# Patient Record
Sex: Male | Born: 2000 | Race: White | Hispanic: Yes | Marital: Single | Smoking: Never smoker
Health system: Southern US, Community
[De-identification: ages and names within clinical notes are randomized; demographics above are authoritative.]

## PROBLEM LIST (undated history)

## (undated) ENCOUNTER — Emergency Department (HOSPITAL_COMMUNITY): Admission: EM | Payer: Medicaid Other | Source: Home / Self Care

## (undated) DIAGNOSIS — E031 Congenital hypothyroidism without goiter: Secondary | ICD-10-CM

## (undated) DIAGNOSIS — S42002A Fracture of unspecified part of left clavicle, initial encounter for closed fracture: Secondary | ICD-10-CM

## (undated) HISTORY — DX: Fracture of unspecified part of left clavicle, initial encounter for closed fracture: S42.002A

## (undated) HISTORY — DX: Congenital hypothyroidism without goiter: E03.1

---
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
## 2021-03-05 DIAGNOSIS — E031 Congenital hypothyroidism without goiter: Secondary | ICD-10-CM | POA: Diagnosis not present

## 2021-03-05 LAB — T4, FREE: Free T4: 1.1 ng/dL (ref 0.8–1.4)

## 2021-03-05 LAB — T4: T4, Total: 7.1 ug/dL (ref 5.1–10.3)

## 2021-03-05 LAB — TSH: TSH: >150 mIU/L — ABNORMAL HIGH (ref 0.50–4.30)

## 2021-03-08 ENCOUNTER — Ambulatory Visit (INDEPENDENT_AMBULATORY_CARE_PROVIDER_SITE_OTHER): Payer: Medicaid Other | Admitting: Family

## 2021-03-08 NOTE — Progress Notes (Deleted)
Subjective:  Subjective  Patient Name: Nicolas Marshall Date of Birth: 2001-07-28  MRN: [id number]  Nicolas Marshall  presents to the office today for follow up evaluation and management of his congenital hypothyroidism  HISTORY OF PRESENT ILLNESS:   Nicolas Marshall is a 20 y.o. Hispanic male   Nicolas Marshall was accompanied by his Grandmother.   1. Nicolas Marshall was diagnosed with congenital hypothyroidism on his NBS at 1 week of life.  He was followed in [place] until age 42 years. At that time family relocated to [place] and he was managed by his PCP there. When they relocated back to [place] his PCP was following [facility]). When mom transitioned his care to [facility] in February 2015, his TSH was elevated at 61. He was supposed to be taking 100 mcg of Synthroid daily. He was referred to endocrinology for further evaluation and management of his thyroid function.   2. Nicolas Marshall's last clinic visit was on 08/2020 In the interim he has been generally healthy.   He will graduate high school in June, he plans to take some time off and work. He is currently working at [employer].   He was increased to 200 mcg of levothyroxine after his last visit. However, he took "10 pills" then did not like the way he felt. He stopped taking it completely for a month or so until his PCP refilled 175 mcg.   He reports he is taking 175 mcg daily, denies missed doses. He denies fatigue and cold intolerance.   3. Pertinent Review of Systems:  Constitutional: good energy. Sleeping.  Eyes: No vision changes. No blurry vision.  Neck: no neck pain. No trouble swallowing  Heart: no palpitation. No chest pain  Lung: No SOB. No trouble breathing  GI: + intermittent constipation. No abdominal pain  Endocrine: See HPI  Neuro: No tremor. No headache.  Psych: normal affect. No depression or anxiety.    PAST MEDICAL, FAMILY, AND SOCIAL HISTORY  Past Medical History:  Diagnosis Date   Closed left clavicular  fracture 04/21/2016   Congenital hypothyroidism     Family History  Problem Relation Age of Onset   Diabetes Mother    Diabetes Maternal Grandmother    Diabetes Paternal Grandmother    Thyroid disease Paternal Grandfather    Asthma Sister      Current Outpatient Medications:    cetirizine (ZYRTEC ALLERGY) 10 MG tablet, Take 1 tablet (10 mg total) by mouth daily for 5 days., Disp: 5 tablet, Rfl: 0   levothyroxine (SYNTHROID) 175 MCG tablet, TAKE 1 TABLET(175 MCG) BY MOUTH DAILY BEFORE BREAKFAST, Disp: 90 tablet, Rfl: 1   ondansetron (ZOFRAN) 8 MG tablet, Take 1 tablet (8 mg total) by mouth every 8 (eight) hours as needed for nausea or vomiting. (Patient not taking: Reported on 09/07/2020), Disp: 10 tablet, Rfl: 0   polyethylene glycol powder (GLYCOLAX/MIRALAX) 17 GM/SCOOP powder, MIX 1 CAPFUL IN 8 OUNCES OF LIQUID AND DRINK DAILY (Patient not taking: Reported on 09/07/2020), Disp: 510 g, Rfl: 0  Allergies as of 03/08/2021   (No Known Allergies)     reports that he has never smoked. He has never used smokeless tobacco. He reports that he does not drink alcohol and does not use drugs. Pediatric History  Patient Parents/Guardians   Nicolas Marshall,Nicolas Marshall (Mother/Guardian)   Other Topics Concern   Not on file  Social History Narrative   Lives at home with mom and three siblings 10 th grade at [employer]    1. School and Family: [employer] 12th grade     2. Activities: soccer for fun  3. Primary Care Provider: Theadore Nan, MD  ROS: There are no other significant problems involving Nicolas Marshall's other body systems.    Objective:  Objective  Vital Signs:  There were no vitals taken for this visit.  Blood pressure percentiles are not available for patients who are 18 years or older.  Ht Readings from Last 3 Encounters:  08/13/20 5' 8.11" (1.73 m) (33 %, Z= -0.45)*  03/27/20 5' 8.35" (1.736 m) (37 %, Z= -0.33)*  04/08/19 5' 7.91" (1.725 m) (37 %, Z= -0.34)*    * Growth percentiles are based on CDC (Boys, 2-20 Years) data.   Wt Readings from Last 3 Encounters:  09/07/20 214 lb 9.6 oz (97.3 kg) (97 %, Z= 1.87)*  08/13/20 211 lb 6.4 oz (95.9 kg) (96 %, Z= 1.81)*  05/19/20 198 lb 9.6 oz (90.1 kg) (94 %, Z= 1.57)*   * Growth percentiles are based on CDC (Boys, 2-20 Years) data.   HC Readings from Last 3 Encounters:  No data found for [facility]   There is no height or weight on file to calculate BSA. No height on file for this encounter. No weight on file for this encounter.    PHYSICAL EXAM: General: Well developed, well nourished male in no acute distress.   Head: Normocephalic, atraumatic.   Eyes:  Pupils equal and round. EOMI.  Sclera white.  No eye drainage.   Ears/Nose/Mouth/Throat: Nares patent, no nasal drainage.  Normal dentition, mucous membranes moist.  Neck: supple, no cervical lymphadenopathy, no thyromegaly Cardiovascular: regular rate, normal S1/S2, no murmurs Respiratory: No increased work of breathing.  Lungs clear to auscultation bilaterally.  No wheezes. Abdomen: soft, nontender, nondistended. Normal bowel sounds.  No appreciable masses  Extremities: warm, well perfused, cap refill < 2 sec.   Musculoskeletal: Normal muscle mass.  Normal strength Skin: warm, dry.  No rash or lesions. Neurologic: alert and oriented, normal speech, no tremor    LAB DATA:      Assessment and Plan:  Assessment  ASSESSMENT: Nicolas Marshall is an 20 year old male with congenital hypothyroidism on levotyroxine therapy. He continues to be noncompliant with medication which has resulted in severely elevated TSH.   PLAN:  1. Diagnostic: Reviewed labs. Repeat TSH, FT4 and T4 in 3 months.  2. Therapeutic: 175 mcg of levothyroxine per day  3. Patient education: Discussed importance of compliance with thyroid medication. Reviewed s/s of hypothyroidism. Answered questions.     LOS: >30  spent today reviewing the medical chart, counseling the  patient/family, and documenting today's visit.     Gretchen Short,  FNP-C  Pediatric Specialist  [address]  [place], [address]  Tele: [phone]

## 2021-04-08 ENCOUNTER — Telehealth (INDEPENDENT_AMBULATORY_CARE_PROVIDER_SITE_OTHER): Payer: Self-pay | Admitting: Family

## 2021-04-08 NOTE — Telephone Encounter (Signed)
Spoke with mom le her know that patient doesn't need lab work per [employer].

## 2021-04-08 NOTE — Telephone Encounter (Signed)
  Who's calling (name and relationship to patient) :mother   Nicolas Marshall (Mother)   Best contact number:  [phone] (Home   Provider they see: Ovidio Kin  Reason for call: Mom want to know if lab work will need to be done before seeing [name]     PRESCRIPTION REFILL ONLY  Name of prescription:  Pharmacy:

## 2021-04-09 ENCOUNTER — Ambulatory Visit (INDEPENDENT_AMBULATORY_CARE_PROVIDER_SITE_OTHER): Payer: Medicaid Other | Admitting: Family

## 2021-04-09 NOTE — Progress Notes (Deleted)
Subjective:  Subjective  Patient Name: Nicolas Marshall Date of Birth: 2001/06/22  MRN: [id number]  Nicolas Marshall  presents to the office today for follow up evaluation and management of his congenital hypothyroidism  HISTORY OF PRESENT ILLNESS:   Nicolas Marshall is a 20 y.o. Hispanic male   Nicolas Marshall was accompanied by his Grandmother.   1. Nicolas Marshall was diagnosed with congenital hypothyroidism on his NBS at 1 week of life.  He was followed in [place] until age 42 years. At that time family relocated to [place] and he was managed by his PCP there. When they relocated back to [place] his PCP was following [facility]). When mom transitioned his care to [facility] in February 2015, his TSH was elevated at 61. He was supposed to be taking 100 mcg of Synthroid daily. He was referred to endocrinology for further evaluation and management of his thyroid function.   2. Nicolas Marshall's last clinic visit was on 03/2019. In the interim he has been generally healthy.   He will graduate high school in June, he plans to take some time off and work. He is currently working at [employer].   He was increased to 200 mcg of levothyroxine after his last visit. However, he took "10 pills" then did not like the way he felt. He stopped taking it completely for a month or so until his PCP refilled 175 mcg.   He reports he is taking 175 mcg daily, denies missed doses. He denies fatigue and cold intolerance.   3. Pertinent Review of Systems:  Constitutional: good energy. Sleeping.  Eyes: No vision changes. No blurry vision.  Neck: no neck pain. No trouble swallowing  Heart: no palpitation. No chest pain  Lung: No SOB. No trouble breathing  GI: + intermittent constipation. No abdominal pain  Endocrine: See HPI  Neuro: No tremor. No headache.  Psych: normal affect. No depression or anxiety.    PAST MEDICAL, FAMILY, AND SOCIAL HISTORY  Past Medical History:  Diagnosis Date   Closed left clavicular  fracture 04/21/2016   Congenital hypothyroidism     Family History  Problem Relation Age of Onset   Diabetes Mother    Diabetes Maternal Grandmother    Diabetes Paternal Grandmother    Thyroid disease Paternal Grandfather    Asthma Sister      Current Outpatient Medications:    cetirizine (ZYRTEC ALLERGY) 10 MG tablet, Take 1 tablet (10 mg total) by mouth daily for 5 days., Disp: 5 tablet, Rfl: 0   levothyroxine (SYNTHROID) 175 MCG tablet, TAKE 1 TABLET(175 MCG) BY MOUTH DAILY BEFORE BREAKFAST, Disp: 90 tablet, Rfl: 1   ondansetron (ZOFRAN) 8 MG tablet, Take 1 tablet (8 mg total) by mouth every 8 (eight) hours as needed for nausea or vomiting. (Patient not taking: Reported on 09/07/2020), Disp: 10 tablet, Rfl: 0   polyethylene glycol powder (GLYCOLAX/MIRALAX) 17 GM/SCOOP powder, MIX 1 CAPFUL IN 8 OUNCES OF LIQUID AND DRINK DAILY (Patient not taking: Reported on 09/07/2020), Disp: 510 g, Rfl: 0  Allergies as of 04/09/2021   (No Known Allergies)     reports that he has never smoked. He has never used smokeless tobacco. He reports that he does not drink alcohol and does not use drugs. Pediatric History  Patient Parents/Guardians   Chavez,Sara (Mother/Guardian)   Other Topics Concern   Not on file  Social History Narrative   Lives at home with mom and three siblings 10 th grade at [employer]    1. School and Family: [employer] 12th grade     2. Activities: soccer for fun  3. Primary Care Provider: Theadore Nan, MD  ROS: There are no other significant problems involving T[place]ond's other body systems.    Objective:  Objective  Vital Signs:  There were no vitals taken for this visit.  Blood pressure percentiles are not available for patients who are 18 years or older.  Ht Readings from Last 3 Encounters:  08/13/20 5' 8.11" (1.73 m) (33 %, Z= -0.45)*  03/27/20 5' 8.35" (1.736 m) (37 %, Z= -0.33)*  04/08/19 5' 7.91" (1.725 m) (37 %, Z= -0.34)*    * Growth percentiles are based on CDC (Boys, 2-20 Years) data.   Wt Readings from Last 3 Encounters:  09/07/20 214 lb 9.6 oz (97.3 kg) (97 %, Z= 1.87)*  08/13/20 211 lb 6.4 oz (95.9 kg) (96 %, Z= 1.81)*  05/19/20 198 lb 9.6 oz (90.1 kg) (94 %, Z= 1.57)*   * Growth percentiles are based on CDC (Boys, 2-20 Years) data.   HC Readings from Last 3 Encounters:  No data found for [facility]   There is no height or weight on file to calculate BSA. No height on file for this encounter. No weight on file for this encounter.    PHYSICAL EXAM: General: Well developed, well nourished male in no acute distress.   Head: Normocephalic, atraumatic.   Eyes:  Pupils equal and round. EOMI.  Sclera white.  No eye drainage.   Ears/Nose/Mouth/Throat: Nares patent, no nasal drainage.  Normal dentition, mucous membranes moist.  Neck: supple, no cervical lymphadenopathy, no thyromegaly Cardiovascular: regular rate, normal S1/S2, no murmurs Respiratory: No increased work of breathing.  Lungs clear to auscultation bilaterally.  No wheezes. Abdomen: soft, nontender, nondistended. Normal bowel sounds.  No appreciable masses  Extremities: warm, well perfused, cap refill < 2 sec.   Musculoskeletal: Normal muscle mass.  Normal strength Skin: warm, dry.  No rash or lesions. Neurologic: alert and oriented, normal speech, no tremor   LAB DATA:      Assessment and Plan:  Assessment  ASSESSMENT:  1. Congenital hypothyroidism: He is clinically euthyroid on 175 mcg of levothyroxine.  2. Constipation: stable on daily fiber supplement.   PLAN:  1. Diagnostic: TSH, FT4 and T4 ordered  2. Therapeutic: 175 mcg of levothyroxine per day  3. Patient education: Reviewed growth chart. Discussed side effects of medication and S/s of hypothyroidism. Answered questions.  6 months follow up     LOS: >30  spent today reviewing the medical chart, counseling the patient/family, and documenting today's visit.     Gretchen Short,  FNP-C  Pediatric Specialist  [address]  [place], [address]  Tele: [phone]

## 2021-04-13 ENCOUNTER — Other Ambulatory Visit: Payer: Self-pay

## 2021-04-13 ENCOUNTER — Ambulatory Visit (INDEPENDENT_AMBULATORY_CARE_PROVIDER_SITE_OTHER): Payer: Medicaid Other | Admitting: Family

## 2021-04-13 ENCOUNTER — Encounter (INDEPENDENT_AMBULATORY_CARE_PROVIDER_SITE_OTHER): Payer: Self-pay | Admitting: Family

## 2021-04-13 VITALS — BP 118/74 | HR 64 | Ht 68.39 in | Wt 225.2 lb

## 2021-04-13 DIAGNOSIS — E031 Congenital hypothyroidism without goiter: Secondary | ICD-10-CM

## 2021-04-13 DIAGNOSIS — K5900 Constipation, unspecified: Secondary | ICD-10-CM

## 2021-04-13 NOTE — Patient Instructions (Addendum)
You need to take your LEVOTHYROXINE everyday  - 175 mcg of levothyroxine per day  - Get labs drawn in 1 month  - follow up in 3 months.   Please sign up for MyChart. This is a communication tool that allows you to send an email directly to me. This can be used for questions, prescriptions and blood sugar reports. We will also release labs to you with instructions on MyChart. Please do not use MyChart if you need immediate or emergency assistance. Ask our wonderful front office staff if you need assistance.   It was a pleasure seeing you in clinic today. Please do not hesitate to contact me if you have questions or concerns.   At [facility], we are committed to providing exceptional care. You will receive a patient satisfaction survey through text or email regarding your visit today. Your opinion is important to me. Comments are appreciated.

## 2021-04-13 NOTE — Progress Notes (Signed)
Subjective:  Subjective  Patient Name: Nicolas Marshall Date of Birth: [birth year]  MRN: [id number]  Nicolas Marshall  presents to the office today for follow up evaluation and management of his congenital hypothyroidism  HISTORY OF PRESENT ILLNESS:   Nicolas Marshall is a 20 y.o. Hispanic male   Nicolas Marshall was accompanied by his Grandmother.   1. Nicolas Marshall was diagnosed with congenital hypothyroidism on his NBS at 1 week of life.  He was followed in [place] until age 60 years. At that time family relocated to [place] and he was managed by his PCP there. When they relocated back to [place] his PCP was following [facility]). When mom transitioned his care to [facility] in February 2015, his TSH was elevated at 61. He was supposed to be taking 100 mcg of Synthroid daily. He was referred to endocrinology for further evaluation and management of his thyroid function.   2. Nicolas Marshall's last clinic visit was on 03/2019. In the interim he has been generally healthy.   He graduated from high school and working at [employer]. He is considering going back to school.  Prescribed to 175 mcg of levothyroxine per day. He reports he stopped taking levothyroxine completely from feb until about a few weeks ago (shortly after he got his labs drawn).  After he got his blood work done he started taking one tablet every morning.   He reports that his energy has gotten much better over the past few weeks. He was struggling with fatigue, weigh gain and cold cold intolerance. He has intermittent constipation   3. Pertinent Review of Systems:  Constitutional: 11 lbs weight gain. Sleeping.  Eyes: No vision changes. No blurry vision.  Neck: no neck pain. No trouble swallowing  Heart: no palpitation. No chest pain  Lung: No SOB. No trouble breathing  GI: + intermittent constipation. No abdominal pain  Endocrine: See HPI  Neuro: No tremor. No headache.  Psych: normal affect. No depression or  anxiety.    PAST MEDICAL, FAMILY, AND SOCIAL HISTORY  Past Medical History:  Diagnosis Date   Closed left clavicular fracture 04/21/2016   Congenital hypothyroidism     Family History  Problem Relation Age of Onset   Diabetes Mother    Diabetes Maternal Grandmother    Diabetes Paternal Grandmother    Thyroid disease Paternal Grandfather    Asthma Sister      Current Outpatient Medications:    levothyroxine (SYNTHROID) 175 MCG tablet, TAKE 1 TABLET(175 MCG) BY MOUTH DAILY BEFORE BREAKFAST, Disp: 90 tablet, Rfl: 1   cetirizine (ZYRTEC ALLERGY) 10 MG tablet, Take 1 tablet (10 mg total) by mouth daily for 5 days., Disp: 5 tablet, Rfl: 0   ondansetron (ZOFRAN) 8 MG tablet, Take 1 tablet (8 mg total) by mouth every 8 (eight) hours as needed for nausea or vomiting. (Patient not taking: No sig reported), Disp: 10 tablet, Rfl: 0   polyethylene glycol powder (GLYCOLAX/MIRALAX) 17 GM/SCOOP powder, MIX 1 CAPFUL IN 8 OUNCES OF LIQUID AND DRINK DAILY (Patient not taking: No sig reported), Disp: 510 g, Rfl: 0  Allergies as of 04/13/2021   (No Known Allergies)     reports that he has never smoked. He has never used smokeless tobacco. He reports that he does not drink alcohol and does not use drugs. Pediatric History  Patient Parents/Guardians   Chavez,Sara (Mother/Guardian)   Other Topics Concern   Not on file  Social History Narrative   Lives at  home with mom and three siblings 10 th grade at [employer]    2. Activities: soccer for fun  3. Primary Care Provider: Theadore Nan, MD  ROS: There are no other significant problems involving [place]'s other body systems.    Objective:  Objective  Vital Signs:  BP 118/74 (BP Location: Right Arm, Patient Position: Sitting, Cuff Size: Normal)   Pulse 64   Ht 5' 8.39" (1.737 m)   Wt 225 lb 3.2 oz (102.2 kg)   BMI 33.86 kg/m   Blood pressure percentiles are not available for patients who are 18 years or older.  Ht  Readings from Last 3 Encounters:  04/13/21 5' 8.39" (1.737 m) (35 %, Z= -0.40)*  08/13/20 5' 8.11" (1.73 m) (33 %, Z= -0.45)*  03/27/20 5' 8.35" (1.736 m) (37 %, Z= -0.33)*   * Growth percentiles are based on CDC (Boys, 2-20 Years) data.   Wt Readings from Last 3 Encounters:  04/13/21 225 lb 3.2 oz (102.2 kg) (98 %, Z= 2.02)*  09/07/20 214 lb 9.6 oz (97.3 kg) (97 %, Z= 1.87)*  08/13/20 211 lb 6.4 oz (95.9 kg) (96 %, Z= 1.81)*   * Growth percentiles are based on CDC (Boys, 2-20 Years) data.   HC Readings from Last 3 Encounters:  No data found for [facility]   Body surface area is 2.22 meters squared. 35 %ile (Z= -0.40) based on CDC (Boys, 2-20 Years) Stature-for-age data based on Stature recorded on 04/13/2021. 98 %ile (Z= 2.02) based on CDC (Boys, 2-20 Years) weight-for-age data using vitals from 04/13/2021.    PHYSICAL EXAM: General: Well developed, well nourished male in no acute distress.   Head: Normocephalic, atraumatic.   Eyes:  Pupils equal and round. EOMI.  Sclera white.  No eye drainage.   Ears/Nose/Mouth/Throat: Nares patent, no nasal drainage.  Normal dentition, mucous membranes moist.  Neck: supple, no cervical lymphadenopathy, no thyromegaly Cardiovascular: regular rate, normal S1/S2, no murmurs Respiratory: No increased work of breathing.  Lungs clear to auscultation bilaterally.  No wheezes. Abdomen: soft, nontender, nondistended. Normal bowel sounds.  No appreciable masses  Extremities: warm, well perfused, cap refill < 2 sec.   Musculoskeletal: Normal muscle mass.  Normal strength Skin: warm, dry.  No rash or lesions. Neurologic: alert and oriented, normal speech, no tremor   LAB DATA:  Results for orders placed or performed in visit on 09/07/20  TSH  Result Value Ref Range   TSH >150.00 (H) 0.50 - 4.30 mIU/L  T4, free  Result Value Ref Range   Free T4 1.0 0.8 - 1.4 ng/dL  T4  Result Value Ref Range   T4, Total 6.3 5.1 - 10.3 mcg/dL       Assessment and Plan:   Assessment  ASSESSMENT:  1. Congenital hypothyroidism: He is clinically and biochemically hypothyroid due to noncompliance with levothyroxine.  2. Constipation: intermittent   PLAN:  1. Diagnostic: Repeat TSH, FT4 and T4 in one month after consistently taking levothyroxine 175 mcg  2. Therapeutic: 175 mcg of levothyroxine per day  3. Patient education: Reviewed growth chart. Stressed importance of compliance with taking levothyroxine daily. Discussed plan for repeat labs and follow up. Answered questions.  3 months. Labs in 1 month.     LOS: >30  spent today reviewing the medical chart, counseling the patient/family, and documenting today's visit.     Gretchen Short,  FNP-C  Pediatric Specialist  [address]  [place], [address]  Tele:  [phone]   

## 2021-07-13 ENCOUNTER — Ambulatory Visit (INDEPENDENT_AMBULATORY_CARE_PROVIDER_SITE_OTHER): Payer: Medicaid Other | Admitting: Family

## 2021-09-18 ENCOUNTER — Other Ambulatory Visit (INDEPENDENT_AMBULATORY_CARE_PROVIDER_SITE_OTHER): Payer: Self-pay | Admitting: Family

## 2021-09-18 DIAGNOSIS — K529 Noninfective gastroenteritis and colitis, unspecified: Secondary | ICD-10-CM

## 2021-12-31 ENCOUNTER — Telehealth (INDEPENDENT_AMBULATORY_CARE_PROVIDER_SITE_OTHER): Payer: Self-pay | Admitting: Family

## 2021-12-31 ENCOUNTER — Other Ambulatory Visit (INDEPENDENT_AMBULATORY_CARE_PROVIDER_SITE_OTHER): Payer: Self-pay | Admitting: Family

## 2021-12-31 DIAGNOSIS — K529 Noninfective gastroenteritis and colitis, unspecified: Secondary | ICD-10-CM

## 2021-12-31 MED ORDER — LEVOTHYROXINE SODIUM 175 MCG PO TABS: ORAL_TABLET | ORAL | 0 refills | Status: DC

## 2021-12-31 NOTE — Telephone Encounter (Signed)
  Name of who is calling: Maralyn Sago (DPR on file giving permission to speak with her).  Caller's Relationship to Patient: Mother  Best contact number: [phone]  Provider they see: Gretchen Short, NP  Reason for call: I advised mother that patient is due for a follow up appointment. She stated patient is working in [place] and goes back and forth between here and there. She will have him call to schedule once he knows his work schedule.      PRESCRIPTION REFILL ONLY  Name of prescription: Levothyroxine   Pharmacy: [facility] on [facility] [address] [facility] [address] [facility] [address] [facility]

## 2022-03-09 ENCOUNTER — Ambulatory Visit (INDEPENDENT_AMBULATORY_CARE_PROVIDER_SITE_OTHER): Payer: Medicaid Other | Admitting: Family

## 2022-04-14 ENCOUNTER — Ambulatory Visit (INDEPENDENT_AMBULATORY_CARE_PROVIDER_SITE_OTHER): Payer: Medicaid Other | Admitting: Family

## 2022-05-02 ENCOUNTER — Ambulatory Visit (INDEPENDENT_AMBULATORY_CARE_PROVIDER_SITE_OTHER): Payer: Medicaid Other | Admitting: Family

## 2022-05-02 ENCOUNTER — Encounter (INDEPENDENT_AMBULATORY_CARE_PROVIDER_SITE_OTHER): Payer: Self-pay | Admitting: Family

## 2022-05-02 ENCOUNTER — Other Ambulatory Visit (INDEPENDENT_AMBULATORY_CARE_PROVIDER_SITE_OTHER): Payer: Self-pay | Admitting: Family

## 2022-05-02 VITALS — BP 118/76 | HR 71 | Wt 226.0 lb

## 2022-05-02 DIAGNOSIS — E031 Congenital hypothyroidism without goiter: Secondary | ICD-10-CM | POA: Diagnosis not present

## 2022-05-02 DIAGNOSIS — R197 Diarrhea, unspecified: Secondary | ICD-10-CM

## 2022-05-02 NOTE — Progress Notes (Signed)
Subjective:  Subjective  Patient Name: Nicolas Marshall Date of Birth: 12/26/2000  MRN: [id number]  Nicolas Marshall  presents to the office today for follow up evaluation and management of his congenital hypothyroidism  HISTORY OF PRESENT ILLNESS:   Nicolas Marshall is a 21 y.o. Hispanic male   Nicolas Marshall was accompanied by his Grandmother.   1. Nicolas Marshall was diagnosed with congenital hypothyroidism on his NBS at 1 week of life.  He was followed in [place] until age 21 years. At that time family relocated to [place] and he was managed by his PCP there. When they relocated back to [place] his PCP was following ([facility]). When mom transitioned his care to [facility] for [place] in February 2015, his TSH was elevated at 61. He was supposed to be taking 100 mcg of Synthroid daily. He was referred to endocrinology for further evaluation and management of his thyroid function.   2. Nicolas Marshall's last clinic visit was on 04/2021 he has been well. He was instructed to follow up one month after restarting levothyroxine therapy at his last visit but cancelled or no show visits on 07/13/2021 and 03/2022.    He is working full time, spends about 2 weeks per month working in [place]. He ran out of levothyroxine about 3 weeks ago but reports taking medication consistently since then. He states that he had a "200 mcg" dose that was left over and he has been cutting that in half. Reports mild fatigue and constipation. Denies constipation.   He went to [place] for vacation about 2 weeks ago, while there he started having diarrhea. He reports he is feeling well but still has occasional diarrhea. No fever, abdominal pain, nausea, or vomiting.    3. Pertinent Review of Systems:  Constitutional: Weight is stable. Sleeping.  Eyes: No vision changes. No blurry vision.  Neck: no neck pain. No trouble swallowing  Heart: no palpitation. No chest pain  Lung: No SOB. No trouble breathing  GI: +diarrhea since returning  form [place] No abdominal pain  Endocrine: See HPI  Neuro: No tremor. No headache.  Psych: normal affect. No depression or anxiety.    PAST MEDICAL, FAMILY, AND SOCIAL HISTORY  Past Medical History:  Diagnosis Date   Closed left clavicular fracture 04/21/2016   Congenital hypothyroidism     Family History  Problem Relation Age of Onset   Diabetes Mother    Diabetes Maternal Grandmother    Diabetes Paternal Grandmother    Thyroid disease Paternal Grandfather    Asthma Sister      Current Outpatient Medications:    levothyroxine (SYNTHROID) 175 MCG tablet, TAKE 1 TABLET(175 MCG) BY MOUTH DAILY BEFORE  BREAKFAST, Disp: 30 tablet, Rfl: 0   cetirizine (ZYRTEC ALLERGY) 10 MG tablet, Take 1 tablet (10 mg total) by mouth daily for 5 days., Disp: 5 tablet, Rfl: 0   ondansetron (ZOFRAN) 8 MG tablet, Take 1 tablet (8 mg total) by mouth every 8 (eight) hours as needed for nausea or vomiting. (Patient not taking: Reported on 09/07/2020), Disp: 10 tablet, Rfl: 0   polyethylene glycol powder (GLYCOLAX/MIRALAX) 17 GM/SCOOP powder, MIX 1 CAPFUL IN 8 OUNCES OF LIQUID AND DRINK DAILY (Patient not taking: No sig reported), Disp: 510 g, Rfl: 0  Allergies as of 05/02/2022   (No Known Allergies)     reports that he has never smoked. He has never used smokeless tobacco. He reports that he does not drink alcohol and does not use drugs. Pediatric History  Patient Parents/Guardians   Chavez,Sara (Mother/Guardian)   Other Topics Concern   Not on file  Social History Narrative   Lives at home with mom and three siblings 10 th grade at the [employer]    2. Activities: soccer for fun  3. Primary Care Provider: Roselind Messier, MD  ROS: There are no other significant problems involving [name]'s other body systems.    Objective:  Objective  Vital Signs:  BP 118/76   Pulse 71   Wt 226 lb (102.5 kg)   BMI 33.98 kg/m   Blood pressure %iles are not available for patients who are 18  years or older.  Ht Readings from Last 3 Encounters:  04/13/21 5' 8.39" (1.737 m) (35 %, Z= -0.40)*  08/13/20 5' 8.11" (1.73 m) (33 %, Z= -0.45)*  03/27/20 5' 8.35" (1.736 m) (37 %, Z= -0.33)*   * Growth percentiles are based on CDC (Boys, 2-20 Years) data.   Wt Readings from Last 3 Encounters:  05/02/22 226 lb (102.5 kg) (98 %, Z= 1.97)*  04/13/21 225 lb 3.2 oz (102.2 kg) (98 %, Z= 2.02)*  09/07/20 214 lb 9.6 oz (97.3 kg) (97 %, Z= 1.87)*   * Growth percentiles are based on CDC (Boys, 2-20 Years) data.   HC Readings from Last 3 Encounters:  No data found for [facility]   Body surface area is 2.22 meters squared. No height on file for this encounter. 98 %ile (Z= 1.97) based on CDC (Boys, 2-20 Years) weight-for-age data using vitals from 05/02/2022.    PHYSICAL EXAM: General: Well developed, well nourished male in no acute distress.  Head: Normocephalic, atraumatic.   Eyes:  Pupils equal and round. EOMI.  Sclera white.  No eye drainage.   Ears/Nose/Mouth/Throat: Nares patent, no nasal drainage.  Normal dentition, mucous membranes moist.  Neck: supple, no cervical lymphadenopathy, no thyromegaly Cardiovascular: regular rate, normal S1/S2, no murmurs Respiratory: No increased work of breathing.  Lungs clear to auscultation bilaterally.  No wheezes. Abdomen: soft, nontender, nondistended. Normal bowel sounds.  No appreciable masses  Extremities: warm, well perfused, cap refill < 2 sec.   Musculoskeletal: Normal muscle mass.  Normal strength Skin: warm, dry.  No rash or lesions. Neurologic: alert and oriented, normal speech, no tremor   LAB DATA:      Assessment and Plan:  Assessment  ASSESSMENT:  1. Congenital hypothyroidism:Clinically hypothyroid with fatigue but he has been inconsistent with taking levothyroxine.  2. Constipation: intermittent  3. Diarrhea: Discussed possible causes, Encourage to follow up with primary care provider or Urgent care if diarrhea persist or he  develops other symptoms. Advised he can try probiotics.   PLAN:  1. Diagnostic: TSH, FT4 and T4 ordered  2. Therapeutic: 175 mcg of levothyroxine per day  3. Patient education: Reviewed all of the above in detail including patient education. Advised he should follow up with PCP about Diarrhea and stressed that if diarrhea persist or symptoms worsen he should go to urgent care.  3 months.     LOS: >30  spent today reviewing the medical chart, counseling the patient/family, and documenting today's visit.     Hermenia Bers,  FNP-C  Pediatric Specialist  [address] [place]  [place], [address]  Tele: [phone]

## 2022-05-02 NOTE — Patient Instructions (Signed)

## 2022-05-03 ENCOUNTER — Other Ambulatory Visit (INDEPENDENT_AMBULATORY_CARE_PROVIDER_SITE_OTHER): Payer: Self-pay | Admitting: Family

## 2022-05-03 DIAGNOSIS — K529 Noninfective gastroenteritis and colitis, unspecified: Secondary | ICD-10-CM

## 2022-05-03 LAB — T4: T4, Total: 3.6 ug/dL — ABNORMAL LOW (ref 5.1–10.3)

## 2022-05-03 LAB — T4, FREE: Free T4: 0.5 ng/dL — ABNORMAL LOW (ref 0.8–1.4)

## 2022-05-03 LAB — TSH: TSH: >150 mIU/L — ABNORMAL HIGH (ref 0.50–4.30)

## 2022-05-03 MED ORDER — LEVOTHYROXINE SODIUM 175 MCG PO TABS: ORAL_TABLET | ORAL | 3 refills | Status: DC

## 2022-05-05 ENCOUNTER — Telehealth (INDEPENDENT_AMBULATORY_CARE_PROVIDER_SITE_OTHER): Payer: Self-pay

## 2022-05-05 NOTE — Telephone Encounter (Signed)
Called and was unable to leave a voicemail patient phone. Per DPR I was able to call mother and let her know of results. She stated she would let him know of results and instructions for medication and future labs.

## 2022-05-05 NOTE — Telephone Encounter (Signed)
-----   Message from Hermenia Bers, NP sent at 05/03/2022  3:32 PM EDT ----- Please call family" Nicolas Marshall, your TSH remains >150 with very low FT4 and T4. This is likely due to not taking your medication. I want you to restart 175 mcg daily. I will order for you to repeat your labs in 6 weeks after consistently taking it every day. Then we can adjust dosage if needed.

## 2022-09-19 ENCOUNTER — Other Ambulatory Visit (INDEPENDENT_AMBULATORY_CARE_PROVIDER_SITE_OTHER): Payer: Self-pay | Admitting: Family

## 2022-09-19 DIAGNOSIS — K529 Noninfective gastroenteritis and colitis, unspecified: Secondary | ICD-10-CM

## 2022-10-31 ENCOUNTER — Encounter (INDEPENDENT_AMBULATORY_CARE_PROVIDER_SITE_OTHER): Payer: Self-pay | Admitting: Family

## 2022-10-31 ENCOUNTER — Ambulatory Visit (INDEPENDENT_AMBULATORY_CARE_PROVIDER_SITE_OTHER): Payer: Medicaid Other | Admitting: Family

## 2022-10-31 VITALS — BP 100/62 | HR 76 | Wt 229.8 lb

## 2022-10-31 DIAGNOSIS — E031 Congenital hypothyroidism without goiter: Secondary | ICD-10-CM | POA: Diagnosis not present

## 2022-10-31 DIAGNOSIS — K5909 Other constipation: Secondary | ICD-10-CM

## 2022-10-31 NOTE — Patient Instructions (Signed)
It was a pleasure seeing you in clinic today. Please do not hesitate to contact me if you have questions or concerns.   Please sign up for MyChart. This is a communication tool that allows you to send an email directly to me. This can be used for questions, prescriptions and blood sugar reports. We will also release labs to you with instructions on MyChart. Please do not use MyChart if you need immediate or emergency assistance. Ask our wonderful front office staff if you need assistance.   - 175 mcg of levothyroxine per day  - Take every day in the morning.  - labs today.

## 2022-10-31 NOTE — Progress Notes (Signed)
Subjective:  Subjective  Patient Name: Nicolas Marshall Date of Birth: 11/17/2000  MRN: [id number]  Nicolas Marshall  presents to the office today for follow up evaluation and management of his congenital hypothyroidism  HISTORY OF PRESENT ILLNESS:   Nicolas Marshall is a 22 y.o. Hispanic male   Colan was accompanied by his Grandmother.   1. Nicolas Marshall was diagnosed with congenital hypothyroidism on his NBS at 1 week of life.  He was followed in [place] until age 51 years. At that time family relocated to [place] and he was managed by his PCP there. When they relocated back to [place] his PCP was following ([facility]). When mom transitioned his care to [facility] for [place] in February 2015, his TSH was elevated at 61. He was supposed to be taking 100 mcg of Synthroid daily. He was referred to endocrinology for further evaluation and management of his thyroid function.   2. Nicolas Marshall's last clinic visit was on 04/2022, he is currently working in [place].   He reports that he is taking levothyroxine consistently. He takes 175 mcg of levothyroxine every morning. Has only missed 2 days since his last visit. He denies fatigue, constipation, cold intolerance and changes to hair and skin.     3. Pertinent Review of Systems:  Constitutional: Weight is stable. Sleeping.  Eyes: No vision changes. No blurry vision.  Neck: no neck pain. No trouble swallowing  Heart: no palpitation. No chest pain  Lung: No SOB. No trouble breathing  GI: No constipation or diarrhea. No abdominal pain  Endocrine: See HPI  Neuro: No tremor. No headache.  Psych: normal affect. No depression or anxiety.    PAST MEDICAL, FAMILY, AND SOCIAL HISTORY  Past Medical History:  Diagnosis Date   Closed left clavicular fracture 04/21/2016   Congenital hypothyroidism     Family History  Problem Relation Age of Onset   Diabetes Mother    Diabetes Maternal Grandmother    Diabetes Paternal Grandmother    Thyroid  disease Paternal Grandfather    Asthma Sister      Current Outpatient Medications:    levothyroxine (SYNTHROID) 175 MCG tablet, TAKE 1 TABLET(175 MCG) BY MOUTH DAILY BEFORE BREAKFAST, Disp: 30 tablet, Rfl: 0   cetirizine (ZYRTEC ALLERGY) 10 MG tablet, Take 1 tablet (10 mg total) by mouth daily for 5 days., Disp: 5 tablet, Rfl: 0   ondansetron (ZOFRAN) 8 MG tablet, Take 1 tablet (8 mg total) by mouth every 8 (eight) hours as needed for nausea or vomiting. (Patient not taking: Reported on 09/07/2020), Disp: 10 tablet, Rfl: 0   polyethylene glycol powder (GLYCOLAX/MIRALAX) 17 GM/SCOOP powder, MIX 1 CAPFUL IN 8 OUNCES OF LIQUID AND DRINK DAILY (Patient not taking: No sig reported), Disp: 510 g, Rfl: 0  Allergies as of 10/31/2022   (No Known Allergies)     reports that he has never smoked. He has never used smokeless tobacco. He reports that he does not drink alcohol and does not use drugs. Pediatric History  Patient Parents/Guardians   Chavez,Sara (Mother/Guardian)   Other Topics Concern   Not on file  Social History Narrative   Lives at home with mom and three siblings 10 th grade at the [employer]    2. Activities: soccer for fun  3. Primary Care Provider: Roselind Messier, MD  ROS: There are no other significant problems involving [place]'s other body systems.    Objective:  Objective  Vital Signs:  BP 100/62  Pulse 76   Wt 229 lb 12.8 oz (104.2 kg)   BMI 34.55 kg/m   Growth %ile SmartLinks can only be used for patients less than 62 years old.  Ht Readings from Last 3 Encounters:  04/13/21 5' 8.39" (1.737 m) (35 %, Z= -0.40)*  08/13/20 5' 8.11" (1.73 m) (33 %, Z= -0.45)*  03/27/20 5' 8.35" (1.736 m) (37 %, Z= -0.33)*   * Growth percentiles are based on CDC (Boys, 2-20 Years) data.   Wt Readings from Last 3 Encounters:  10/31/22 229 lb 12.8 oz (104.2 kg)  05/02/22 226 lb (102.5 kg) (98 %, Z= 1.97)*  04/13/21 225 lb 3.2 oz (102.2 kg) (98 %, Z= 2.02)*    * Growth percentiles are based on CDC (Boys, 2-20 Years) data.   HC Readings from Last 3 Encounters:  No data found for [facility]   Body surface area is 2.24 meters squared. Facility age limit for growth %iles is 20 years. Facility age limit for growth %iles is 20 years.    PHYSICAL EXAM:  General: Well developed, well nourished male in no acute distress.  Head: Normocephalic, atraumatic.   Eyes:  Pupils equal and round. EOMI.  Sclera white.  No eye drainage.   Ears/Nose/Mouth/Throat: Nares patent, no nasal drainage.  Normal dentition, mucous membranes moist.  Neck: supple, no cervical lymphadenopathy, no thyromegaly Cardiovascular: regular rate, normal S1/S2, no murmurs Respiratory: No increased work of breathing.  Lungs clear to auscultation bilaterally.  No wheezes. Abdomen: soft, nontender, nondistended. Normal bowel sounds.  No appreciable masses  Extremities: warm, well perfused, cap refill < 2 sec.   Musculoskeletal: Normal muscle mass.  Normal strength Skin: warm, dry.  No rash or lesions. Neurologic: alert and oriented, normal speech, no tremor   LAB DATA:      Assessment and Plan:  Assessment  ASSESSMENT:  1. Congenital hypothyroidism: He clinic euthyroid on levothyroxine.  2. Constipation: intermittent  \ PLAN:  1. Diagnostic: TSH, FT4 and T4 ordered  2. Therapeutic: 175 mcg of levothyroxine per day. Stressed importance of compliance with medication.  3. Patient education: Discussed growth chart. Discussed s/s of hypothyroidism. Labs today and will adjust medication pending labs. .  6 months.     LOS: >30  spent today reviewing the medical chart, counseling the patient/family, and documenting today's visit.     Hermenia Bers,  FNP-C  Pediatric Specialist  [address] [place]  [place], [address]  Tele: [phone]

## 2022-11-01 LAB — T4, FREE: Free T4: 1 ng/dL (ref 0.8–1.4)

## 2022-11-01 LAB — TSH: TSH: 45.31 mIU/L — ABNORMAL HIGH (ref 0.40–4.50)

## 2022-11-01 LAB — T4: T4, Total: 6.2 ug/dL (ref 5.1–10.3)

## 2022-11-02 ENCOUNTER — Other Ambulatory Visit (INDEPENDENT_AMBULATORY_CARE_PROVIDER_SITE_OTHER): Payer: Self-pay | Admitting: Family

## 2022-11-02 DIAGNOSIS — K529 Noninfective gastroenteritis and colitis, unspecified: Secondary | ICD-10-CM

## 2022-11-14 ENCOUNTER — Encounter (INDEPENDENT_AMBULATORY_CARE_PROVIDER_SITE_OTHER): Payer: Self-pay

## 2022-11-15 ENCOUNTER — Other Ambulatory Visit (INDEPENDENT_AMBULATORY_CARE_PROVIDER_SITE_OTHER): Payer: Self-pay | Admitting: Family

## 2022-11-15 MED ORDER — LEVOTHYROXINE SODIUM 200 MCG PO TABS: 200.0000 ug | ORAL_TABLET | Freq: Every day | ORAL | 5 refills | Status: DC

## 2023-02-15 ENCOUNTER — Other Ambulatory Visit (INDEPENDENT_AMBULATORY_CARE_PROVIDER_SITE_OTHER): Payer: Self-pay | Admitting: Family

## 2023-05-08 ENCOUNTER — Ambulatory Visit (INDEPENDENT_AMBULATORY_CARE_PROVIDER_SITE_OTHER): Payer: Medicaid Other | Admitting: Family

## 2023-07-04 ENCOUNTER — Ambulatory Visit (INDEPENDENT_AMBULATORY_CARE_PROVIDER_SITE_OTHER): Payer: Medicaid Other | Admitting: Family

## 2023-07-04 ENCOUNTER — Encounter (INDEPENDENT_AMBULATORY_CARE_PROVIDER_SITE_OTHER): Payer: Self-pay | Admitting: Family

## 2023-07-04 VITALS — BP 122/80 | HR 84 | Ht 67.68 in | Wt 256.4 lb

## 2023-07-04 DIAGNOSIS — E031 Congenital hypothyroidism without goiter: Secondary | ICD-10-CM

## 2023-07-04 DIAGNOSIS — R635 Abnormal weight gain: Secondary | ICD-10-CM | POA: Diagnosis not present

## 2023-07-04 NOTE — Progress Notes (Signed)
Subjective:  Subjective  Patient Name: Nicolas Marshall Date of Birth: April 30, 2001  MRN: [id number]  Nicolas Marshall  presents to the office today for follow up evaluation and management of his congenital hypothyroidism  HISTORY OF PRESENT ILLNESS:   Nicolas Marshall is a 22 y.o. Hispanic male   Nicolas Marshall was accompanied by his Grandmother.   1. Nicolas Marshall was diagnosed with congenital hypothyroidism on his NBS at 1 week of life.  He was followed in [place] until age 75 years. At that time family relocated to [place] and he was managed by his PCP there. When they relocated back to [place] his PCP was following [facility]). When mom transitioned his care to [facility] in February 2015, his TSH was elevated at 61. He was supposed to be taking 100 mcg of Synthroid daily. He was referred to endocrinology for further evaluation and management of his thyroid function.   2. Nicolas Marshall's last clinic visit was on 04/2022, he is currently working in [place].   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 He is exercising occasionally. Reports diet has improved since moving back home.   Takes 200 mcg of levothyroxine per day around 1130 am. Denies missed doses. Denies fatigue, constipation and cold intolerance. He does state that for about 2 months he was not taking levothyroxine but has been consistent recently.    3. Pertinent Review of Systems:  Constitutional: 27 lbs weight gain. . Sleeping.  Eyes: No vision changes. No blurry vision.  Neck: no neck pain. No trouble swallowing  Heart: no palpitation. No chest pain  Lung: No SOB. No trouble breathing  GI: No constipation or diarrhea. No abdominal pain  Endocrine: See HPI  Neuro: No tremor. No headache.  Psych: normal affect. No depression or anxiety.    PAST MEDICAL, FAMILY, AND SOCIAL HISTORY  Past Medical History:  Diagnosis Date   Closed left clavicular fracture 04/21/2016   Congenital  hypothyroidism     Family History  Problem Relation Age of Onset   Diabetes Mother    Diabetes Maternal Grandmother    Diabetes Paternal Grandmother    Thyroid disease Paternal Grandfather    Asthma Sister      Current Outpatient Medications:    levothyroxine (SYNTHROID) 200 MCG tablet, TAKE 1 TABLET(200 MCG) BY MOUTH DAILY, Disp: 30 tablet, Rfl: 5   polyethylene glycol powder (GLYCOLAX/MIRALAX) 17 GM/SCOOP powder, MIX 1 CAPFUL IN 8 OUNCES OF LIQUID AND DRINK DAILY, Disp: 510 g, Rfl: 0   cetirizine (ZYRTEC ALLERGY) 10 MG tablet, Take 1 tablet (10 mg total) by mouth daily for 5 days., Disp: 5 tablet, Rfl: 0   ondansetron (ZOFRAN) 8 MG tablet, Take 1 tablet (8 mg total) by mouth every 8 (eight) hours as needed for nausea or vomiting. (Patient not taking: Reported on 09/07/2020), Disp: 10 tablet, Rfl: 0  Allergies as of 07/04/2023   (No Known Allergies)     reports that he has never smoked. He has never used smokeless tobacco. He reports that he does not drink alcohol and does not use drugs. Pediatric History  Patient Parents/Guardians   Chavez,Sara (Mother/Guardian)   Other Topics Concern   Not on file  Social History Narrative   Lives at home with mom and three siblings 10 th grade at the [employer]    2. Activities: soccer for fun  3. Primary Care Provider: Theadore Nan, MD  ROS: There are no  other significant problems involving [place]'s other body systems.    Objective:  Objective  Vital Signs:  BP 122/80 (BP Location: Left Arm, Patient Position: Sitting, Cuff Size: Normal)   Pulse 84   Ht 5' 7.68" (1.719 m)   Wt 256 lb 6.4 oz (116.3 kg)   BMI 39.36 kg/m   Growth %ile SmartLinks can only be used for patients less than 63 years old.  Ht Readings from Last 3 Encounters:  07/04/23 5' 7.68" (1.719 m)  04/13/21 5' 8.39" (1.737 m) (35%, Z= -0.40)*  08/13/20 5' 8.11" (1.73 m) (33%, Z= -0.45)*   * Growth percentiles are based on CDC (Boys, 2-20  Years) data.   Wt Readings from Last 3 Encounters:  07/04/23 256 lb 6.4 oz (116.3 kg)  10/31/22 229 lb 12.8 oz (104.2 kg)  05/02/22 226 lb (102.5 kg) (98%, Z= 1.97)*   * Growth percentiles are based on CDC (Boys, 2-20 Years) data.   HC Readings from Last 3 Encounters:  No data found for [facility]   Body surface area is 2.36 meters squared. Facility age limit for growth %iles is 20 years. Facility age limit for growth %iles is 20 years.    PHYSICAL EXAM:  General: Well developed, well nourished male in no acute distress.  Head: Normocephalic, atraumatic.   Eyes:  Pupils equal and round. EOMI.  Sclera white.  No eye drainage.   Ears/Nose/Mouth/Throat: Nares patent, no nasal drainage.  Normal dentition, mucous membranes moist.  Neck: supple, no cervical lymphadenopathy, no thyromegaly Cardiovascular: regular rate, normal S1/S2, no murmurs Respiratory: No increased work of breathing.  Lungs clear to auscultation bilaterally.  No wheezes. Abdomen: soft, nontender, nondistended. Normal bowel sounds.  No appreciable masses  Extremities: warm, well perfused, cap refill < 2 sec.   Musculoskeletal: Normal muscle mass.  Normal strength Skin: warm, dry.  No rash or lesions. Neurologic: alert and oriented, normal speech, no tremor   LAB DATA:      Assessment and Plan:  Assessment  ASSESSMENT: Kayan Vogeler is a 22 y.o. male with congenital hypothyroid. He has a history of poor compliance with levothyroxine but reports improved compliance since last visit. He has gained 27 lbs but otherwise is euthyroid.   1. Congenital hypothyroidism:  - Discussed growth chart  - Reviewed s/s of hypothyroidism and importance of taking levothyroxine as prescribed.  . Lab Orders         TSH         T4, free         T4    - 200 mcg of levothyroxine per day.   2. Weight gain.  - reviewed diet and encouraged improvements.  - Exercise at least 30 minutes per day     LOS: >>30  spent today reviewing the  medical chart, counseling the patient/family, and documenting today's visit.    Gretchen Short, DNP, FNP-C  Pediatric Specialist  [address]  [place], [address]  Tele: [phone]

## 2023-07-04 NOTE — Patient Instructions (Signed)

## 2023-07-05 ENCOUNTER — Telehealth (INDEPENDENT_AMBULATORY_CARE_PROVIDER_SITE_OTHER): Payer: Self-pay

## 2023-07-05 ENCOUNTER — Other Ambulatory Visit (INDEPENDENT_AMBULATORY_CARE_PROVIDER_SITE_OTHER): Payer: Self-pay | Admitting: Family

## 2023-07-05 LAB — TSH: TSH: 31.02 m[IU]/L — ABNORMAL HIGH (ref 0.40–4.50)

## 2023-07-05 LAB — T4: T4, Total: 7.6 ug/dL (ref 4.9–10.5)

## 2023-07-05 LAB — T4, FREE: Free T4: 1.2 ng/dL (ref 0.8–1.8)

## 2023-07-05 MED ORDER — LEVOTHYROXINE SODIUM 112 MCG PO TABS: 225.0000 ug | ORAL_TABLET | Freq: Every day | ORAL | 5 refills | Status: DC

## 2023-07-05 NOTE — Telephone Encounter (Signed)
Called Nicolas Marshall and read labs, patient has no questions at this time

## 2023-11-01 ENCOUNTER — Ambulatory Visit (INDEPENDENT_AMBULATORY_CARE_PROVIDER_SITE_OTHER): Payer: Medicaid Other | Admitting: Family

## 2023-11-01 ENCOUNTER — Encounter (INDEPENDENT_AMBULATORY_CARE_PROVIDER_SITE_OTHER): Payer: Self-pay | Admitting: Family

## 2023-11-01 VITALS — BP 118/80 | HR 83 | Wt 270.4 lb

## 2023-11-01 DIAGNOSIS — E031 Congenital hypothyroidism without goiter: Secondary | ICD-10-CM

## 2023-11-01 DIAGNOSIS — R635 Abnormal weight gain: Secondary | ICD-10-CM | POA: Diagnosis not present

## 2023-11-01 NOTE — Patient Instructions (Signed)

## 2023-11-01 NOTE — Progress Notes (Signed)
 Subjective:  Subjective  Patient Name: Nicolas Marshall Date of Birth: [birth year]  MRN: [id number]  Nicolas Marshall  presents to the office today for follow up evaluation and management of his congenital hypothyroidism  HISTORY OF PRESENT ILLNESS:   Nicolas Marshall is a 23 y.o. Hispanic male   Nicolas Marshall was accompanied by his Grandmother.   1. Nicolas Marshall was diagnosed with congenital hypothyroidism on his NBS at 1 week of life.  He was followed in [place] until age 74 years. At that time family relocated to [place] and he was managed by his PCP there. When they relocated back to [place] his PCP was following [facility]). When mom transitioned his care to [facility] in February 2015, his TSH was elevated at 61. He was supposed to be taking 100 mcg of Synthroid daily. He was referred to endocrinology for further evaluation and management of his thyroid function.   2. Nicolas Marshall's last clinic visit was on 06/2023, he is currently working in [place].   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 Exercises by playing soccer 2 days per week. Reports that diet has not been very good, eating out frequently.   He is was instructed to take 2 tablets of 112 mcg levothyroxine (225 mcg) per day after his last labs. However, he "forgot" and has been taking 1 tablet for 112 mcg instead of the recommended 225 mcg. Denies fatigue, constipation and cold intolerance.     3. Pertinent Review of Systems:  Constitutional: . Sleeping. 14 lbs weight gain  Eyes: No vision changes. No blurry vision.  Neck: no neck pain. No trouble swallowing  Heart: no palpitation. No chest pain  Lung: No SOB. No trouble breathing  GI: No constipation or diarrhea. No abdominal pain  Endocrine: See HPI  Neuro: No tremor. No headache.  Psych: normal affect. No depression or anxiety.    PAST MEDICAL, FAMILY, AND SOCIAL HISTORY  Past Medical History:  Diagnosis Date   Closed left clavicular  fracture 04/21/2016   Congenital hypothyroidism     Family History  Problem Relation Age of Onset   Diabetes Mother    Diabetes Maternal Grandmother    Diabetes Paternal Grandmother    Thyroid disease Paternal Grandfather    Asthma Sister      Current Outpatient Medications:    levothyroxine (SYNTHROID) 112 MCG tablet, Take 2 tablets (225 mcg total) by mouth daily., Disp: 60 tablet, Rfl: 5   cetirizine (ZYRTEC ALLERGY) 10 MG tablet, Take 1 tablet (10 mg total) by mouth daily for 5 days., Disp: 5 tablet, Rfl: 0   ondansetron (ZOFRAN) 8 MG tablet, Take 1 tablet (8 mg total) by mouth every 8 (eight) hours as needed for nausea or vomiting. (Patient not taking: Reported on 11/01/2023), Disp: 10 tablet, Rfl: 0   polyethylene glycol powder (GLYCOLAX/MIRALAX) 17 GM/SCOOP powder, MIX 1 CAPFUL IN 8 OUNCES OF LIQUID AND DRINK DAILY (Patient not taking: Reported on 11/01/2023), Disp: 510 g, Rfl: 0  Allergies as of 11/01/2023   (No Known Allergies)     reports that he has never smoked. He has never used smokeless tobacco. He reports that he does not drink alcohol and does not use drugs. Pediatric History  Patient Parents/Guardians   Nicolas Marshall (Mother/Guardian)   Other Topics Concern   Not on file  Social History Narrative   Lives at home with mom and three siblings    Works in [employer] representative    2. Activities: soccer  for fun  3. Primary Care Provider: Theadore Nan, MD  ROS: There are no other significant problems involving Nicolas Marshall's other body systems.    Objective:  Objective  Vital Signs:  BP 118/80 (BP Location: Left Arm, Patient Position: Sitting, Cuff Size: Large)   Pulse 83   Wt 270 lb 6.4 oz (122.7 kg)   BMI 41.51 kg/m   Growth %ile SmartLinks can only be used for patients less than 4 years old.  Ht Readings from Last 3 Encounters:  07/04/23 5' 7.68" (1.719 m)  04/13/21 5' 8.39" (1.737 m) (35%, Z= -0.40)*  08/13/20 5' 8.11" (1.73 m) (33%, Z= -0.45)*   * Growth  percentiles are based on CDC (Boys, 2-20 Years) data.   Wt Readings from Last 3 Encounters:  11/01/23 270 lb 6.4 oz (122.7 kg)  07/04/23 256 lb 6.4 oz (116.3 kg)  10/31/22 229 lb 12.8 oz (104.2 kg)   HC Readings from Last 3 Encounters:  No data found for [facility]   Body surface area is 2.42 meters squared. Facility age limit for growth %iles is 20 years. Facility age limit for growth %iles is 20 years.    PHYSICAL EXAM:  General: Well developed, well nourished male in no acute distress.   Head: Normocephalic, atraumatic.   Eyes:  Pupils equal and round. EOMI.  Sclera white.  No eye drainage.   Ears/Nose/Mouth/Throat: Nares patent, no nasal drainage.  Normal dentition, mucous membranes moist.  Neck: supple, no cervical lymphadenopathy, no thyromegaly Cardiovascular: regular rate, normal S1/S2, no murmurs Respiratory: No increased work of breathing.  Lungs clear to auscultation bilaterally.  No wheezes. Abdomen: soft, nontender, nondistended. Normal bowel sounds.  No appreciable masses  Extremities: warm, well perfused, cap refill < 2 sec.   Musculoskeletal: Normal muscle mass.  Normal strength Skin: warm, dry.  No rash or lesions. Neurologic: alert and oriented, normal speech, no tremor    LAB DATA:      Assessment and Plan:  Assessment  ASSESSMENT: Deitrich Walkup is a 23 y.o. male with congenital hypothyroid. He has not been taking levothyroxine as prescribed (taking half the recommended dose). He has gained 14 but is clinically euthyroid.   1. Congenital hypothyroidism:  - Reviewed growth chart.  - Discussed s/s of hypothyroidism.  - Adjust levothyroxine pending labs.  Lab Orders         TSH         T4, free         T4      2. Weight gain.  - -Eliminate sugary drinks (regular soda, juice, sweet tea, regular gatorade) from your diet -Drink water or milk (preferably 1% or skim) -Avoid fried foods and junk food (chips, cookies, candy) -Watch portion sizes -Pack your  lunch for school -Try to get 30 minutes of activity daily     LOS: 32 minutes  spent today reviewing the medical chart, counseling the patient/family, and documenting today's visit.     Gretchen Short, DNP, FNP-C  Pediatric Specialist  [address]  [place], [address]  Tele: [phone]

## 2023-11-02 ENCOUNTER — Other Ambulatory Visit (INDEPENDENT_AMBULATORY_CARE_PROVIDER_SITE_OTHER): Payer: Self-pay | Admitting: Family

## 2023-11-02 LAB — T4: T4, Total: 8.1 ug/dL (ref 4.9–10.5)

## 2023-11-02 LAB — TSH: TSH: 6.81 m[IU]/L — ABNORMAL HIGH (ref 0.40–4.50)

## 2023-11-02 LAB — T4, FREE: Free T4: 1.4 ng/dL (ref 0.8–1.8)

## 2023-11-02 MED ORDER — LEVOTHYROXINE SODIUM 137 MCG PO TABS: 137.0000 ug | ORAL_TABLET | Freq: Every day | ORAL | 3 refills | Status: AC

## 2023-11-03 ENCOUNTER — Telehealth (INDEPENDENT_AMBULATORY_CARE_PROVIDER_SITE_OTHER): Payer: Self-pay

## 2023-11-03 NOTE — Telephone Encounter (Signed)
 Called mom, ok per DPR to relay result notes. Mom verbalized good understanding and has no questions at this time. Mom did ask about his mychart, she stated Lenus forgot his password and his phone is disconnected. I did let mom know to call the number that shows up when he tries to log in so they can help him with that. Mom verbalized understanding.

## 2023-11-03 NOTE — Telephone Encounter (Signed)
-----   Message from [facility] sent at 11/02/2023  4:20 PM EDT ----- Please call Nicolas Marshall. His TSH has improve despite taking half the recommended dose. This is likely due to his increased compliance with taking levothyroxine. I am going to increase his dose slightly from 112 mcg to 137 mcg. New prescription sent. He should dispose of the old medication to prevent taking the wrong amount.

## 2023-12-19 ENCOUNTER — Telehealth (INDEPENDENT_AMBULATORY_CARE_PROVIDER_SITE_OTHER): Payer: Self-pay | Admitting: Family

## 2023-12-19 NOTE — Telephone Encounter (Signed)
 [facility] will need a referral to Adult Endo.

## 2024-01-09 NOTE — Telephone Encounter (Signed)
 Attempted to call [facility] no answer, left HIPAA approved message to return call

## 2024-01-12 NOTE — Telephone Encounter (Signed)
 Called Nicolas Marshall, his phone is disconnected. Called mom and asked her to let [name] his option for adult endo. Mom stated she will find out and let us  know Monday.

## 2024-01-15 ENCOUNTER — Encounter (INDEPENDENT_AMBULATORY_CARE_PROVIDER_SITE_OTHER): Payer: Self-pay

## 2024-01-15 NOTE — Telephone Encounter (Signed)
 Letter sent.

## 2024-01-15 NOTE — Telephone Encounter (Signed)
 Attempted to call Nicolas Marshall no answer, left HIPAA approved message to return call

## 2024-03-13 ENCOUNTER — Encounter (INDEPENDENT_AMBULATORY_CARE_PROVIDER_SITE_OTHER): Payer: Self-pay

## 2024-03-13 ENCOUNTER — Ambulatory Visit (INDEPENDENT_AMBULATORY_CARE_PROVIDER_SITE_OTHER): Admitting: Family

## 2024-09-17 ENCOUNTER — Ambulatory Visit
